# Patient Record
Sex: Female | Born: 1970 | Race: White | Hispanic: No | Marital: Married | Smoking: Never smoker
Health system: Southern US, Community
[De-identification: ages and names within clinical notes are randomized; demographics above are authoritative.]

## PROBLEM LIST (undated history)

## (undated) HISTORY — PX: ADENOIDECTOMY: SUR15

## (undated) HISTORY — PX: TONSILLECTOMY: SUR1361

---

## 1991-10-16 HISTORY — PX: BREAST EXCISIONAL BIOPSY: SUR124

## 1998-10-15 HISTORY — PX: BREAST EXCISIONAL BIOPSY: SUR124

## 2000-08-28 ENCOUNTER — Other Ambulatory Visit: Admission: RE | Admit: 2000-08-28 | Discharge: 2000-08-28 | Payer: Self-pay | Admitting: *Deleted

## 2001-05-08 ENCOUNTER — Encounter: Admission: RE | Admit: 2001-05-08 | Discharge: 2001-05-08 | Payer: Self-pay | Admitting: *Deleted

## 2001-05-08 ENCOUNTER — Encounter: Payer: Self-pay | Admitting: *Deleted

## 2001-10-23 ENCOUNTER — Other Ambulatory Visit: Admission: RE | Admit: 2001-10-23 | Discharge: 2001-10-23 | Payer: Self-pay | Admitting: Obstetrics and Gynecology

## 2001-10-30 ENCOUNTER — Encounter: Admission: RE | Admit: 2001-10-30 | Discharge: 2002-01-28 | Payer: Self-pay

## 2002-03-25 ENCOUNTER — Other Ambulatory Visit: Admission: RE | Admit: 2002-03-25 | Discharge: 2002-03-25 | Payer: Self-pay | Admitting: Obstetrics and Gynecology

## 2002-07-23 ENCOUNTER — Ambulatory Visit (HOSPITAL_COMMUNITY): Admission: RE | Admit: 2002-07-23 | Discharge: 2002-07-23 | Payer: Self-pay | Admitting: Obstetrics and Gynecology

## 2002-08-26 ENCOUNTER — Encounter (HOSPITAL_COMMUNITY): Admission: RE | Admit: 2002-08-26 | Discharge: 2002-09-25 | Payer: Self-pay | Admitting: *Deleted

## 2002-09-26 ENCOUNTER — Inpatient Hospital Stay (HOSPITAL_COMMUNITY): Admission: AD | Admit: 2002-09-26 | Discharge: 2002-09-28 | Payer: Self-pay | Admitting: Obstetrics and Gynecology

## 2002-10-13 ENCOUNTER — Encounter: Admission: RE | Admit: 2002-10-13 | Discharge: 2002-11-12 | Payer: Self-pay | Admitting: Obstetrics and Gynecology

## 2002-10-26 ENCOUNTER — Other Ambulatory Visit: Admission: RE | Admit: 2002-10-26 | Discharge: 2002-10-26 | Payer: Self-pay | Admitting: Obstetrics and Gynecology

## 2003-04-15 ENCOUNTER — Other Ambulatory Visit: Admission: RE | Admit: 2003-04-15 | Discharge: 2003-04-15 | Payer: Self-pay | Admitting: Obstetrics and Gynecology

## 2004-04-18 ENCOUNTER — Other Ambulatory Visit: Admission: RE | Admit: 2004-04-18 | Discharge: 2004-04-18 | Payer: Self-pay | Admitting: Obstetrics and Gynecology

## 2005-06-12 ENCOUNTER — Other Ambulatory Visit: Admission: RE | Admit: 2005-06-12 | Discharge: 2005-06-12 | Payer: Self-pay | Admitting: Obstetrics and Gynecology

## 2008-07-05 ENCOUNTER — Encounter: Admission: RE | Admit: 2008-07-05 | Discharge: 2008-07-05 | Payer: Self-pay | Admitting: Obstetrics and Gynecology

## 2008-07-07 ENCOUNTER — Encounter: Admission: RE | Admit: 2008-07-07 | Discharge: 2008-07-07 | Payer: Self-pay | Admitting: Obstetrics and Gynecology

## 2008-12-27 ENCOUNTER — Encounter: Admission: RE | Admit: 2008-12-27 | Discharge: 2008-12-27 | Payer: Self-pay | Admitting: Obstetrics and Gynecology

## 2009-02-18 ENCOUNTER — Encounter: Admission: RE | Admit: 2009-02-18 | Discharge: 2009-02-18 | Payer: Self-pay | Admitting: Obstetrics and Gynecology

## 2009-04-04 ENCOUNTER — Encounter (HOSPITAL_COMMUNITY): Admission: RE | Admit: 2009-04-04 | Discharge: 2009-07-03 | Payer: Self-pay | Admitting: Endocrinology

## 2009-04-27 ENCOUNTER — Ambulatory Visit (HOSPITAL_COMMUNITY): Admission: RE | Admit: 2009-04-27 | Discharge: 2009-04-27 | Payer: Self-pay | Admitting: Endocrinology

## 2010-07-03 ENCOUNTER — Encounter: Admission: RE | Admit: 2010-07-03 | Discharge: 2010-07-03 | Payer: Self-pay | Admitting: Obstetrics and Gynecology

## 2010-11-05 ENCOUNTER — Encounter: Payer: Self-pay | Admitting: Neurology

## 2011-01-21 LAB — HCG, SERUM, QUALITATIVE: Preg, Serum: NEGATIVE

## 2011-03-02 NOTE — Consult Note (Signed)
[facility]  Patient:    Deborah Frederick, Deborah Frederick Visit Number: [id number] MRN: [id number]          Service Type: PMG Location: TPC Attending Physician:  Sondra Come Dictated by:   Sondra Come, M.D. Proc. Date: 11/14/01 Admit Date:  10/30/2001   CC:         Sheran Luz, M.D., [facility]   Consultation Report  HISTORY OF PRESENT ILLNESS:  Ms. Mcglynn returns to clinic as scheduled for reevaluation.  She was initially seen on October 31, 2001, at which time she underwent a left S1 selective nerve root block/transforaminal epidural steroid injection for a presumed left S1 distribution radiculitis.  The patient states that she had nearly completely relief for approximately three days but the pain has returned back to baseline.  She continues to have pain mainly when she lays down.  She also notes it inconsistently when she sits for long periods of time.  She denies any pain with walking or running.  Her pain is a 4-5/10 on a subjective scale.  Her function and quality of life indices improved remarkably for the first three days after the injection although have declined somewhat since.  She states that her sleep is good when she takes Ultracet.  She notes that she has tried neuromuscular stimulation in the past and chiropractic manipulation without any significant relief.  We discussed treatment options at length.  I reviewed the health and history form, and 14-point review of systems.  Incidentally, Mrs. 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  PHYSICAL EXAMINATION:  GENERAL:  The patient is a healthy female in no acute distress.  VITAL SIGNS:  Blood pressure is 115/72, pulse 65, respirations 18, O2 saturation is 99% on room air.  MUSCULOSKELETAL:  Manual muscle testing is 5/5 bilateral  lower extremities. Sensory examination is intact to light touch bilateral lower extremities in all dermatomal distributions.  Muscle stretch reflexes are 2+/4 bilateral patellar, medial hamstring, and Achilles.  Straight leg raise test and FABER are negative bilaterally.  IMPRESSION: 1. 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  Etiology is    uncertain.  I question the clinical significance of the Tarlovs cyst which    resides at S2 level.  It is quite possible that there may be a component of    S2 radiculopathy involved and the relief from the S1 transforaminal    epidural steroid injection was due to extravasation of the injectate    inferiorly to the S2 dorsal root ganglion. 2. No clinical evidence of sacroiliac joint pain.  PLAN: 1. I had a thorough discussion with Mrs. Breiner regarding treatment options.    These would include repeat transforaminal epidural steroid injection but I    am skeptical of the long-term benefit of an S1 transforaminal epidural    steroid injection.  We can also consider performing an S2 selective nerve    root block with possible cyst aspiration.  Could also consider CT-guided    cyst aspiration diagnostically and therapeutically as well as possible    injection of fibrin glue which has been reported to help decrease the    recurrence of Tarlovs cysts.  I think surgical intervention for this    Tarlovs cyst would be based solely on true diagnostic evidence that the    cyst is  causing the pain and the patients symptoms.  At this point, I    think this would be the last resort. 2. After a long discussion with the patient regarding these options, I think    it is reasonable that I consult colleagues and formulate a plan to address    this problem.  I discussed this with Mrs. Sardinas who is in agreement. 3. I will contact the patient after discussing with colleagues and formulate a    plan with her input.  The patient was educated on the  above findings and recommendations, and understands.  There were no barriers to communication. Dictated by:   Sondra Come, M.D. Attending Physician:  Sondra Come DD:  11/15/01 TD:  11/16/01 Job: [id number]

## 2011-03-02 NOTE — Consult Note (Signed)
[facility]  Patient:    Deborah Frederick, Deborah Frederick Visit Number: [id number] MRN: [id number]          Service Type: PMG Location: TPC Attending Physician:  Sondra Come Dictated by:   Sondra Come, M.D. Proc. Date: 10/31/01 Admit Date:  10/30/2001   CC:         Deborah Frederick, M.D., [facility]   Consultation Report  NEW PATIENT CONSULTATION  REFERRING Deborah Frederick:  Dr. Sheran Frederick, [facility]  Dear Dr. Ethelene Frederick,  Thank you very much for kindly referring your wife, Deborah Frederick, [facility] for evaluation and possible left S1 selective nerve root block.  Deborah Frederick was evaluated in the clinic on October 31, 2001, and underwent an uneventful left S1 selective nerve root block.  Please refer to the following for details regarding the history, physical examination and treatment plan.  Once again, thank you for allowing [place] to participate in the care of your wife.  CHIEF COMPLAINT:  Left posterior thigh and calf pain.  HISTORY OF PRESENT ILLNESS:  Deborah Frederick is a pleasant 40 year old right hand dominant female who [facility] with a three year history of progressive pain in the left posterior thigh and calf.  Onset of the patients pain is typically when laying down. She denies any similar symptoms with running or sitting.  She had an MRI of her lumbar spine which revealed a 10x15 mm cyst in the sacral canal on the left at the S2 level most compatible with a perineural or Tarlovs cyst.  Also incidentally noted was a right ovarian cyst measuring approximately 2x3 cm. The patient underwent an interlaminar lumbar epidural steroid injection without any relief.  She denies any bowel and bladder dysfunction.  Currently she takes Ultracet typically one and possibly two at bedtime with significant relief of her symptoms.  At this time, she denies any pain in her left  lower extremity.  She has not had any physical therapy.  Her pain symptoms are described as dull and constant without any significant decline in her function and quality of life indices, and her sleep is good with the medications.  I review the health and history form, and 14-point review of systems.  No other neurologic complaints.  PAST MEDICAL HISTORY:  Denies.  PAST SURGICAL HISTORY:  Lumpectomy x2.  FAMILY HISTORY:  Heart disease, hypertension and aneurysm.  SOCIAL HISTORY:  The patient denies smoking or alcohol use.  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  ALLERGIES:  No known drug allergies.  CURRENT MEDICATIONS:  Ultracet and prenatal vitamins.  PHYSICAL EXAMINATION:  GENERAL:  The patient is a healthy female in no acute distress.  The patient is pleasant.  Gait is normal.  Affect and mood are appropriate.  VITAL SIGNS:  Blood pressure 123/77, pulse 87, respirations 18, O2 saturation 98% on room air.  SPINE:  Level pelvis without scoliosis.  There is normal lumbar lordosis. There is minimal tenderness to palpation bilateral lumbar paraspinals if any. Range of motion of the lumbar spine is full in all planes without significant discomfort.  MUSCULOSKELETAL:  Manual muscle testing is 5/5 bilateral lower extremities in all muscle groups tested.  Sensory examination is intact to light touch bilateral lower extremities in all dermatomal distributions.  Muscle stretch reflexes are 2+/4 bilateral patellar, medial hamstrings, and Achilles. Straight leg raise is negative bilaterally.  Deborah Frederick is negative bilaterally. [place] is negative bilaterally.  Sacral compression test is negative bilaterally.  There is no heat, erythema, or edema in the lower extremities.  IMPRESSION: 1. Chronic intermittent left lower extremity radicular symptoms in an S1    distribution. The patient has MRI evidence of a Tarlovs cyst at the left    S2 region which is fairly large.   Anatomically this is not contacting    the S1 nerve root although I question whether or not the cyst may be    contributing to the patients current symptoms when she is in a recumbent    position. During this time gravity may be allowing some cephalad migration    of the cyst causing some irritation to the S1 nerve although this is    unlikely. 2. Physical examination is not consistent with sacroiliac joint dysfunction or    pain.  PLAN:  I had a thorough discussion with Deborah Frederick regarding her symptoms and possible etiology.  We discussed the MRI findings and their implications.  At this point, I believe it is reasonable to proceed with a left S1 selective nerve root block diagnostically and therapeutically.  I described the procedure to the patient in detail including risks, benefits, limitations and alternatives.  She wishes to proceed.  DESCRIPTION OF PROCEDURE:  Deborah Frederick was brought back to the fluoroscopy suite and placed on the table in prone position.  Her skin was prepped and draped in the usual sterile fashion.  Skin and subcutaneous tissue was anesthetized with 3 cc of 1% lidocaine. Under direct fluoroscopic guidance, a 22-gauge 3-1/2 inch spinal needle was advanced into the left S1 neural foramen.  The needle tip placement was confirmed in oblique, AP and lateral views.  After negative aspiration, injection of 1 cc of Omnipaque 180 revealed a left S1 epiradiculogram. There was no pain or paresthesia noted.  No vascular uptake was noted.  This  was then injected with 1 cc of Kenalog 40 mg/cc plus 1 cc of preservative free 1% lidocaine with needle flush.  No complications.  The patient tolerated the procedure well. Post-injection pain level is 0/10.  RECOMMENDATIONS: 1. Continue current medications. 2. I had the discussion with Dr. Ethelene Frederick and Deborah Frederick regarding possible    further options which may include repeat selective nerve root block versus    CT guided needle  aspiration of the Tarlovs cyst diagnostically and     therapeutically versus possible injection of fibrin glue into the cyst if    this indeed is the source of her pain. 3. Will have Mrs. Dechellis return to clinic in two weeks for reevaluation.  Will    predicate further intervention on the patients response to the above    procedure.  The patient was educated in the above findings and recommendations, and understands.  There were no barriers to communication.  Mrs. Schroeter was evaluated in a chaperoned environment. Dictated by:   Sondra Come, M.D. Attending Physician:  Sondra Come DD:  11/01/01 TD:  11/03/01 Job: [phone] [id number]

## 2011-05-28 ENCOUNTER — Other Ambulatory Visit: Payer: Self-pay | Admitting: Obstetrics and Gynecology

## 2011-05-28 DIAGNOSIS — Z1231 Encounter for screening mammogram for malignant neoplasm of breast: Secondary | ICD-10-CM

## 2011-07-09 ENCOUNTER — Ambulatory Visit
Admission: RE | Admit: 2011-07-09 | Discharge: 2011-07-09 | Disposition: A | Discharge status: Home / Self Care | Payer: No Typology Code available for payment source | Source: Ambulatory Visit | Attending: Obstetrics and Gynecology | Admitting: Obstetrics and Gynecology

## 2011-07-09 DIAGNOSIS — Z1231 Encounter for screening mammogram for malignant neoplasm of breast: Secondary | ICD-10-CM

## 2012-06-10 ENCOUNTER — Other Ambulatory Visit: Payer: Self-pay | Admitting: Obstetrics and Gynecology

## 2012-06-10 DIAGNOSIS — Z1231 Encounter for screening mammogram for malignant neoplasm of breast: Secondary | ICD-10-CM

## 2012-07-09 ENCOUNTER — Ambulatory Visit
Admission: RE | Admit: 2012-07-09 | Discharge: 2012-07-09 | Disposition: A | Discharge status: Home / Self Care | Payer: No Typology Code available for payment source | Source: Ambulatory Visit | Attending: Obstetrics and Gynecology | Admitting: Obstetrics and Gynecology

## 2012-07-09 DIAGNOSIS — Z1231 Encounter for screening mammogram for malignant neoplasm of breast: Secondary | ICD-10-CM

## 2013-06-01 ENCOUNTER — Other Ambulatory Visit: Payer: Self-pay

## 2013-06-01 DIAGNOSIS — Z1231 Encounter for screening mammogram for malignant neoplasm of breast: Secondary | ICD-10-CM

## 2013-07-10 ENCOUNTER — Ambulatory Visit
Admission: RE | Admit: 2013-07-10 | Discharge: 2013-07-10 | Disposition: A | Discharge status: Home / Self Care | Payer: No Typology Code available for payment source | Source: Ambulatory Visit

## 2013-07-10 DIAGNOSIS — Z1231 Encounter for screening mammogram for malignant neoplasm of breast: Secondary | ICD-10-CM

## 2013-07-15 ENCOUNTER — Other Ambulatory Visit: Payer: Self-pay | Admitting: Obstetrics and Gynecology

## 2013-07-15 DIAGNOSIS — R928 Other abnormal and inconclusive findings on diagnostic imaging of breast: Secondary | ICD-10-CM

## 2013-07-21 ENCOUNTER — Ambulatory Visit
Admission: RE | Admit: 2013-07-21 | Discharge: 2013-07-21 | Disposition: A | Discharge status: Home / Self Care | Payer: Self-pay | Source: Ambulatory Visit | Attending: Obstetrics and Gynecology | Admitting: Obstetrics and Gynecology

## 2013-07-21 DIAGNOSIS — R928 Other abnormal and inconclusive findings on diagnostic imaging of breast: Secondary | ICD-10-CM

## 2014-06-14 ENCOUNTER — Other Ambulatory Visit: Payer: Self-pay

## 2014-06-14 DIAGNOSIS — Z1231 Encounter for screening mammogram for malignant neoplasm of breast: Secondary | ICD-10-CM

## 2014-07-19 ENCOUNTER — Encounter (INDEPENDENT_AMBULATORY_CARE_PROVIDER_SITE_OTHER): Payer: Self-pay

## 2014-07-19 ENCOUNTER — Ambulatory Visit
Admission: RE | Admit: 2014-07-19 | Discharge: 2014-07-19 | Disposition: A | Discharge status: Home / Self Care | Payer: No Typology Code available for payment source | Source: Ambulatory Visit

## 2014-07-19 DIAGNOSIS — Z1231 Encounter for screening mammogram for malignant neoplasm of breast: Secondary | ICD-10-CM

## 2015-06-22 ENCOUNTER — Other Ambulatory Visit: Payer: Self-pay

## 2015-06-22 DIAGNOSIS — Z1231 Encounter for screening mammogram for malignant neoplasm of breast: Secondary | ICD-10-CM

## 2015-07-27 ENCOUNTER — Ambulatory Visit
Admission: RE | Admit: 2015-07-27 | Discharge: 2015-07-27 | Disposition: A | Discharge status: Home / Self Care | Payer: No Typology Code available for payment source | Source: Ambulatory Visit

## 2015-07-27 DIAGNOSIS — Z1231 Encounter for screening mammogram for malignant neoplasm of breast: Secondary | ICD-10-CM

## 2016-06-19 ENCOUNTER — Other Ambulatory Visit: Payer: Self-pay | Admitting: Obstetrics and Gynecology

## 2016-06-19 DIAGNOSIS — Z1231 Encounter for screening mammogram for malignant neoplasm of breast: Secondary | ICD-10-CM

## 2016-07-27 ENCOUNTER — Ambulatory Visit
Admission: RE | Admit: 2016-07-27 | Discharge: 2016-07-27 | Disposition: A | Discharge status: Home / Self Care | Payer: Managed Care, Other (non HMO) | Source: Ambulatory Visit | Attending: Obstetrics and Gynecology | Admitting: Obstetrics and Gynecology

## 2016-07-27 DIAGNOSIS — Z1231 Encounter for screening mammogram for malignant neoplasm of breast: Secondary | ICD-10-CM

## 2017-06-18 ENCOUNTER — Other Ambulatory Visit: Payer: Self-pay | Admitting: Obstetrics and Gynecology

## 2017-06-18 DIAGNOSIS — Z1231 Encounter for screening mammogram for malignant neoplasm of breast: Secondary | ICD-10-CM

## 2017-07-29 ENCOUNTER — Ambulatory Visit
Admission: RE | Admit: 2017-07-29 | Discharge: 2017-07-29 | Disposition: A | Discharge status: Home / Self Care | Payer: Managed Care, Other (non HMO) | Source: Ambulatory Visit | Attending: Obstetrics and Gynecology | Admitting: Obstetrics and Gynecology

## 2017-07-29 DIAGNOSIS — Z1231 Encounter for screening mammogram for malignant neoplasm of breast: Secondary | ICD-10-CM

## 2018-02-25 DIAGNOSIS — Z1212 Encounter for screening for malignant neoplasm of rectum: Secondary | ICD-10-CM | POA: Diagnosis not present

## 2018-02-25 DIAGNOSIS — Z6825 Body mass index (BMI) 25.0-25.9, adult: Secondary | ICD-10-CM | POA: Diagnosis not present

## 2018-02-25 DIAGNOSIS — Z01419 Encounter for gynecological examination (general) (routine) without abnormal findings: Secondary | ICD-10-CM | POA: Diagnosis not present

## 2018-02-27 DIAGNOSIS — M2242 Chondromalacia patellae, left knee: Secondary | ICD-10-CM | POA: Diagnosis not present

## 2018-02-27 DIAGNOSIS — M1712 Unilateral primary osteoarthritis, left knee: Secondary | ICD-10-CM | POA: Diagnosis not present

## 2018-03-25 DIAGNOSIS — Z0000 Encounter for general adult medical examination without abnormal findings: Secondary | ICD-10-CM | POA: Diagnosis not present

## 2018-03-25 DIAGNOSIS — E0500 Thyrotoxicosis with diffuse goiter without thyrotoxic crisis or storm: Secondary | ICD-10-CM | POA: Diagnosis not present

## 2018-04-01 DIAGNOSIS — Z0000 Encounter for general adult medical examination without abnormal findings: Secondary | ICD-10-CM | POA: Diagnosis not present

## 2018-04-01 DIAGNOSIS — Z800 Family history of malignant neoplasm of digestive organs: Secondary | ICD-10-CM | POA: Diagnosis not present

## 2018-04-01 DIAGNOSIS — Z1389 Encounter for screening for other disorder: Secondary | ICD-10-CM | POA: Diagnosis not present

## 2018-04-22 DIAGNOSIS — D1801 Hemangioma of skin and subcutaneous tissue: Secondary | ICD-10-CM | POA: Diagnosis not present

## 2018-04-22 DIAGNOSIS — Z85828 Personal history of other malignant neoplasm of skin: Secondary | ICD-10-CM | POA: Diagnosis not present

## 2018-04-22 DIAGNOSIS — L570 Actinic keratosis: Secondary | ICD-10-CM | POA: Diagnosis not present

## 2018-04-22 DIAGNOSIS — L821 Other seborrheic keratosis: Secondary | ICD-10-CM | POA: Diagnosis not present

## 2018-04-22 DIAGNOSIS — I788 Other diseases of capillaries: Secondary | ICD-10-CM | POA: Diagnosis not present

## 2018-06-20 ENCOUNTER — Other Ambulatory Visit: Payer: Self-pay | Admitting: Obstetrics and Gynecology

## 2018-06-20 DIAGNOSIS — Z1231 Encounter for screening mammogram for malignant neoplasm of breast: Secondary | ICD-10-CM

## 2018-07-30 ENCOUNTER — Ambulatory Visit
Admission: RE | Admit: 2018-07-30 | Discharge: 2018-07-30 | Disposition: A | Discharge status: Home / Self Care | Payer: BLUE CROSS/BLUE SHIELD | Source: Ambulatory Visit | Attending: Obstetrics and Gynecology | Admitting: Obstetrics and Gynecology

## 2018-07-30 DIAGNOSIS — Z1231 Encounter for screening mammogram for malignant neoplasm of breast: Secondary | ICD-10-CM

## 2018-09-01 DIAGNOSIS — H524 Presbyopia: Secondary | ICD-10-CM | POA: Diagnosis not present

## 2018-09-30 DIAGNOSIS — L245 Irritant contact dermatitis due to other chemical products: Secondary | ICD-10-CM | POA: Diagnosis not present

## 2018-09-30 DIAGNOSIS — Z85828 Personal history of other malignant neoplasm of skin: Secondary | ICD-10-CM | POA: Diagnosis not present

## 2019-01-05 DIAGNOSIS — L821 Other seborrheic keratosis: Secondary | ICD-10-CM | POA: Diagnosis not present

## 2019-01-05 DIAGNOSIS — Z85828 Personal history of other malignant neoplasm of skin: Secondary | ICD-10-CM | POA: Diagnosis not present

## 2019-01-05 DIAGNOSIS — L245 Irritant contact dermatitis due to other chemical products: Secondary | ICD-10-CM | POA: Diagnosis not present

## 2019-01-05 DIAGNOSIS — L570 Actinic keratosis: Secondary | ICD-10-CM | POA: Diagnosis not present

## 2019-02-04 DIAGNOSIS — L509 Urticaria, unspecified: Secondary | ICD-10-CM | POA: Diagnosis not present

## 2019-02-04 DIAGNOSIS — R591 Generalized enlarged lymph nodes: Secondary | ICD-10-CM | POA: Diagnosis not present

## 2019-02-27 DIAGNOSIS — Z6824 Body mass index (BMI) 24.0-24.9, adult: Secondary | ICD-10-CM | POA: Diagnosis not present

## 2019-02-27 DIAGNOSIS — Z01419 Encounter for gynecological examination (general) (routine) without abnormal findings: Secondary | ICD-10-CM | POA: Diagnosis not present

## 2019-03-04 DIAGNOSIS — M2242 Chondromalacia patellae, left knee: Secondary | ICD-10-CM | POA: Diagnosis not present

## 2019-04-06 DIAGNOSIS — E0500 Thyrotoxicosis with diffuse goiter without thyrotoxic crisis or storm: Secondary | ICD-10-CM | POA: Diagnosis not present

## 2019-04-06 DIAGNOSIS — L509 Urticaria, unspecified: Secondary | ICD-10-CM | POA: Diagnosis not present

## 2019-04-06 DIAGNOSIS — R591 Generalized enlarged lymph nodes: Secondary | ICD-10-CM | POA: Diagnosis not present

## 2019-04-08 DIAGNOSIS — E0500 Thyrotoxicosis with diffuse goiter without thyrotoxic crisis or storm: Secondary | ICD-10-CM | POA: Diagnosis not present

## 2019-04-08 DIAGNOSIS — Z0000 Encounter for general adult medical examination without abnormal findings: Secondary | ICD-10-CM | POA: Diagnosis not present

## 2019-04-09 DIAGNOSIS — E7849 Other hyperlipidemia: Secondary | ICD-10-CM | POA: Diagnosis not present

## 2019-04-09 DIAGNOSIS — R82998 Other abnormal findings in urine: Secondary | ICD-10-CM | POA: Diagnosis not present

## 2019-04-13 DIAGNOSIS — R591 Generalized enlarged lymph nodes: Secondary | ICD-10-CM | POA: Diagnosis not present

## 2019-04-13 DIAGNOSIS — Z800 Family history of malignant neoplasm of digestive organs: Secondary | ICD-10-CM | POA: Diagnosis not present

## 2019-04-13 DIAGNOSIS — Z1331 Encounter for screening for depression: Secondary | ICD-10-CM | POA: Diagnosis not present

## 2019-04-13 DIAGNOSIS — L509 Urticaria, unspecified: Secondary | ICD-10-CM | POA: Diagnosis not present

## 2019-04-13 DIAGNOSIS — E0500 Thyrotoxicosis with diffuse goiter without thyrotoxic crisis or storm: Secondary | ICD-10-CM | POA: Diagnosis not present

## 2019-04-13 DIAGNOSIS — Z0000 Encounter for general adult medical examination without abnormal findings: Secondary | ICD-10-CM | POA: Diagnosis not present

## 2019-04-30 ENCOUNTER — Ambulatory Visit (INDEPENDENT_AMBULATORY_CARE_PROVIDER_SITE_OTHER): Payer: BC Managed Care – PPO | Admitting: Allergy

## 2019-04-30 ENCOUNTER — Encounter: Payer: Self-pay | Admitting: Allergy

## 2019-04-30 ENCOUNTER — Other Ambulatory Visit: Payer: Self-pay

## 2019-04-30 VITALS — BP 114/70 | HR 87 | Temp 98.4°F | Resp 17 | Ht 69.0 in | Wt 165.5 lb

## 2019-04-30 DIAGNOSIS — L299 Pruritus, unspecified: Secondary | ICD-10-CM | POA: Insufficient documentation

## 2019-04-30 DIAGNOSIS — R21 Rash and other nonspecific skin eruption: Secondary | ICD-10-CM

## 2019-04-30 MED ORDER — HYDROXYZINE HCL 25 MG PO TABS: 25.0000 mg | ORAL_TABLET | Freq: Every evening | ORAL | 3 refills | Status: AC | PRN

## 2019-04-30 NOTE — Patient Instructions (Addendum)
Today's skin testing showed: Negative to environmental allergy panel and basic common foods.    Stop all supplements for at least 1 month.  Continue zyrtec 10mg  in the morning.  Take hydroxyzine 25mg  at night 1 hour before bedtime as needed for itching. This replaces benadryl.  Stop Singulair.     Start proper skin care as below.   Get bloodwork   If rash still persistent and bloodwork negative then recommend patch testing next.   Follow up in 4 weeks   Skin care recommendations  Bath time: . Always use lukewarm water. AVOID very hot or cold water. Marland Kitchen Keep bathing time to 5-10 minutes. . Do NOT use bubble bath. . Use a mild soap and use just enough to wash the dirty areas. . Do NOT scrub skin vigorously.  . After bathing, pat dry your skin with a towel. Do NOT rub or scrub the skin.  Moisturizers and prescriptions:  . ALWAYS apply moisturizers immediately after bathing (within 3 minutes). This helps to lock-in moisture. . Use the moisturizer several times a day over the whole body. Kermit Balo summer moisturizers include: Aveeno, CeraVe, Cetaphil. Kermit Balo winter moisturizers include: Aquaphor, Vaseline, Cerave, Cetaphil, Eucerin, Vanicream. . When using moisturizers along with medications, the moisturizer should be applied about one hour after applying the medication to prevent diluting effect of the medication or moisturize around where you applied the medications. When not using medications, the moisturizer can be continued twice daily as maintenance.  Laundry and clothing: . Avoid laundry products with added color or perfumes. . Use unscented hypo-allergenic laundry products such as Tide free, Cheer free & gentle, and All free and clear.  . If the skin still seems dry or sensitive, you can try double-rinsing the clothes. . Avoid tight or scratchy clothing such as wool. . Do not use fabric softeners or dyer sheets.

## 2019-04-30 NOTE — Progress Notes (Signed)
New Patient Note  RE: Deborah Frederick MRN: [id number] DOB: 09/09/1971 Date of Office Visit: 04/30/2019  Referring provider: Chilton GreathouseAvva, Ravisankar, MD Primary care provider: Chilton GreathouseAvva, Ravisankar, MD  Chief Complaint: Rash  History of Present Illness: I had the pleasure of seeing Deborah MochaLeslie Frederick for initial evaluation at the [facility] on 04/30/2019. She is a 48 y.o. female, who is referred here by Chilton GreathouseAvva, Ravisankar, MD for the evaluation of rash.   Rash: Rash started about 4 months ago. Started under the bra strap in the middle of the back. Then it spread to her legs, arms and abdominal area.   Describes the rash as pruritic, erythematous, raised and bumpy. Individual rashes lasts about a few weeks. No ecchymosis upon resolution. Associated symptoms include: none. Suspected triggers are unknown. She does a lot of her yardwork at home. Denies coming into contact with anything new. She has been avoiding yardwork for the past few weeks with no improvement in her rash. No one else has this rash at home. She does take some OTC supplements which she was on prior to the rash starting. Denies recent tick bites. Does consume red meat on a consistent basis.   Denies any fevers, chills, changes in medications, foods, personal care products or recent infections. She has tried the following therapies: triamcinolone cream, zyrtec, benadryl, singulair with some benefit. Systemic steroids 2 courses with some benefit but no complete resolution. Currently on zyrtec 10mg  and Singulair 10mg  in the AM and benadryl at night.  Previous work up includes: none. Had recent bloodwork which showed normal CBC and TSH.  Previous history of rash/hives: denies. Patient is up to date with the following cancer screening tests: mammogram, pap smears.  Assessment and Plan: Deborah Frederick is a 48 y.o. female with: Rash and other nonspecific skin eruption Pruritic erythematous rash for the past 4 months with no triggers noted.  Denies changes in diet, medications, personal care products or recent infections. Tried triamcinolone cream, zyrtec, benadryl and Singulair with some benefit. Had 2 courses of prednisone which helped but never had complete resolution of symptoms.   Today's skin testing showed: Negative to environmental allergy panel and basic common foods.   Stop all OTC supplements for 1 month.  Continue zyrtec 10mg  in the morning.  Take hydroxyzine 25mg  at night 1 hour before bedtime as needed for itching. This replaces benadryl.  Stop Singulair.    Discussed proper skin care.   Get bloodwork to rule out any other underlying etiologies.   May use topical triamcinolone cream BID prn. Do not use on the neck or face.   If rash still persistent and bloodwork negative then recommend patch testing next.  Pruritus  See assessment and plan as above for rash.   Return in about 4 weeks (around 05/28/2019).  Meds ordered this encounter  Medications  . hydrOXYzine (ATARAX/VISTARIL) 25 MG tablet    Sig: Take 1 tablet (25 mg total) by mouth at bedtime as needed for itching (1 hour before bedtime).    Dispense:  30 tablet    Refill:  3    Lab Orders     ANA w/Reflex     Alpha-Gal Panel     Comprehensive metabolic panel     Tryptase  Other allergy screening: Asthma: no Rhino conjunctivitis: yes  She reports symptoms of sneezing, PND. Symptoms have been going on for few years. The symptoms are present during the spring and summer. lShe has used no medications for this. Previous work  up includes: none. Food allergy: no Medication allergy: no Hymenoptera allergy: no Eczema:no History of recurrent infections suggestive of immunodeficency: no  Diagnostics: Skin Testing: Environmental allergy panel and select foods. Negative test to: environmental allergy panel and basic common foods.  Results discussed with patient/family. Airborne Adult Perc - 04/30/19 1017    Time Antigen Placed  1017     Allergen Manufacturer  Lavella Hammock    Location  Back    Number of Test  59    Panel 1  Select    1. Control-Buffer 50% Glycerol  Negative    2. Control-Histamine 1 mg/ml  4+    3. Albumin saline  Negative    4. [place]  Negative    5. [place]  Negative    6. Johnson  Negative    7. [place] Blue  Negative    8. Meadow Fescue  Negative    9. Perennial Rye  Negative    10. Sweet Vernal  Negative    11. Timothy  Negative    12. Cocklebur  Negative    13. Burweed Marshelder  Negative    14. Ragweed, short  Negative    15. Ragweed, Giant  Negative    16. Plantain,  English  Negative    17. Lamb's Quarters  Negative    18. Sheep Sorrell  Negative    19. Rough Pigweed  Negative    20. Marsh Elder, Rough  Negative    21. Mugwort, Common  Negative    22. Ash mix  Negative    23. Birch mix  Negative    24. Beech American  Negative    25. Box, Elder  Negative    26. Cedar, red  Negative    27. Cottonwood, [place]  Negative    28. Elm mix  Negative    29. Hickory mix  Negative    30. Maple mix  Negative    31. Oak, [place] mix  Negative    32. Pecan Pollen  Negative    33. Pine mix  Negative    34. Sycamore Eastern  Negative    35. [place], Black Pollen  Negative    36. Alternaria alternata  Negative    37. Cladosporium Herbarum  Negative    38. Aspergillus mix  Negative    39. Penicillium mix  Negative    40. Bipolaris sorokiniana (Helminthosporium)  Negative    41. Drechslera spicifera (Curvularia)  Negative    42. Mucor plumbeus  Negative    43. Fusarium moniliforme  Negative    44. Aureobasidium pullulans (pullulara)  Negative    45. Rhizopus oryzae  Negative    46. Botrytis cinera  Negative    47. Epicoccum nigrum  Negative    48. Phoma betae  Negative    49. Candida Albicans  Negative    50. Trichophyton mentagrophytes  Negative    51. Mite, D Farinae  5,000 AU/ml  Negative    52. Mite, D Pteronyssinus  5,000 AU/ml  Negative    53. Cat Hair 10,000 BAU/ml  Negative    54.   Dog Epithelia  Negative    55. Mixed Feathers  Negative    56. Horse Epithelia  Negative    57. Cockroach, German  Negative    58. Mouse  Negative    59. Tobacco Leaf  Negative     Food Perc - 04/30/19 1018    Time Antigen Placed  1018    Allergen Manufacturer  Lavella Hammock  Location  Back    Number of allergen test  10    Food  Select    1. Peanut  Negative    2. Soybean food  Negative    3. Wheat, whole  Negative    4. Sesame  Negative    5. Milk, cow  Negative    6. Egg White, chicken  Negative    7. Casein  Negative    8. Shellfish mix  Negative    9. Fish mix  Negative    10. Cashew  Negative       Past Medical History: Patient Active Problem List   Diagnosis Date Noted  . Pruritus 04/30/2019  . Rash and other nonspecific skin eruption 04/30/2019   History reviewed. No pertinent past medical history. Past Surgical History: Past Surgical History:  Procedure Laterality Date  . ADENOIDECTOMY    . BREAST EXCISIONAL BIOPSY Left 1993  . BREAST EXCISIONAL BIOPSY Left 2000  . TONSILLECTOMY     Medication List:  Current Outpatient Medications  Medication Sig Dispense Refill  . cetirizine (ZYRTEC) 10 MG tablet Take 10 mg by mouth daily as needed for allergies.    Marland Kitchen. SYNTHROID 137 MCG tablet     . triamcinolone cream (KENALOG) 0.1 %     . hydrOXYzine (ATARAX/VISTARIL) 25 MG tablet Take 1 tablet (25 mg total) by mouth at bedtime as needed for itching (1 hour before bedtime). 30 tablet 3   No current facility-administered medications for this visit.    Allergies: No Known Allergies Social History: Social History   Socioeconomic History  . Marital status: Married    Spouse name: Not on file  . Number of children: Not on file  . Years of education: Not on file  . Highest education level: Not on file  Occupational History  . Not on file  Social Needs  . Financial resource strain: Not on file  . Food insecurity    Worry: Not on file    Inability: Not on file  .  Transportation needs    Medical: Not on file    Non-medical: Not on file  Tobacco Use  . Smoking status: Never Smoker  . Smokeless tobacco: Never Used  Substance and Sexual Activity  . Alcohol use: Yes    Alcohol/week: 1.0 standard drinks    Types: 1 Glasses of wine per week  . Drug use: Not on file  . Sexual activity: Not on file  Lifestyle  . Physical activity    Days per week: Not on file    Minutes per session: Not on file  . Stress: Not on file  Relationships  . Social Musicianconnections    Talks on phone: Not on file    Gets together: Not on file    Attends religious service: Not on file    Active member of club or organization: Not on file    Attends meetings of clubs or organizations: Not on file    Relationship status: Not on file  Other Topics Concern  . Not on file  Social History Narrative  . Not on file   Lives in a house which is 49 year old. Smoking: denies Occupation: stays at home  Environmental History: Water Damage/mildew in the house: no Carpet in the family room: no Carpet in the bedroom: no Heating: electric Cooling: central Pet: yes 1 dog x 10 yrs  Family History: Family History  Problem Relation Age of Onset  . Breast cancer Maternal Grandmother   .  Asthma Mother   . Allergic rhinitis Father    Review of Systems  Constitutional: Negative for appetite change, chills, fever and unexpected weight change.  HENT: Negative for congestion and rhinorrhea.   Eyes: Negative for itching.  Respiratory: Negative for cough, chest tightness, shortness of breath and wheezing.   Cardiovascular: Negative for chest pain.  Gastrointestinal: Negative for abdominal pain.  Genitourinary: Negative for difficulty urinating.  Skin: Positive for rash.  Allergic/Immunologic: Negative for environmental allergies and food allergies.  Neurological: Negative for headaches.   Objective: BP 114/70 (BP Location: Right Arm, Patient Position: Sitting, Cuff Size: Normal)    Pulse 87   Temp 98.4 F (36.9 C) (Temporal)   Resp 17   Ht 5\' 9"  (1.753 m)   Wt 165 lb 8 oz (75.1 kg)   SpO2 97%   BMI 24.44 kg/m  Body mass index is 24.44 kg/m. Physical Exam  Constitutional: She is oriented to person, place, and time. She appears well-developed and well-nourished.  HENT:  Head: Normocephalic and atraumatic.  Right Ear: External ear normal.  Left Ear: External ear normal.  Nose: Nose normal.  Mouth/Throat: Oropharynx is clear and moist.  Eyes: Conjunctivae and EOM are normal.  Neck: Neck supple.  Cardiovascular: Normal rate, regular rhythm and normal heart sounds. Exam reveals no gallop and no friction rub.  No murmur heard. Pulmonary/Chest: Effort normal and breath sounds normal. She has no wheezes. She has no rales.  Abdominal: Soft.  Neurological: She is alert and oriented to person, place, and time.  Skin: Skin is warm. Rash noted.  Erythematous maculopapular rash diffusely present on torso, upper and lower extremities b/l.  Psychiatric: She has a normal mood and affect. Her behavior is normal.  Nursing note and vitals reviewed.  The plan was reviewed with the patient/family, and all questions/concerned were addressed.  It was my pleasure to see Deborah Frederick today and participate in her care. Please feel free to contact me with any questions or concerns.  Sincerely,  Wyline MoodYoon Bilbo Carcamo, DO Allergy & Immunology  [facility] [place] office: [phone] [facility] office: [phone] [place] office: [phone]

## 2019-04-30 NOTE — Assessment & Plan Note (Signed)
   See assessment and plan as above for rash. 

## 2019-04-30 NOTE — Assessment & Plan Note (Addendum)
Pruritic erythematous rash for the past 4 months with no triggers noted. Denies changes in diet, medications, personal care products or recent infections. Tried triamcinolone cream, zyrtec, benadryl and Singulair with some benefit. Had 2 courses of prednisone which helped but never had complete resolution of symptoms.   Today's skin testing showed: Negative to environmental allergy panel and basic common foods.   Stop all OTC supplements for 1 month.  Continue zyrtec 10mg  in the morning.  Take hydroxyzine 25mg  at night 1 hour before bedtime as needed for itching. This replaces benadryl.  Stop Singulair.    Discussed proper skin care.   Get bloodwork to rule out any other underlying etiologies.   May use topical triamcinolone cream BID prn. Do not use on the neck or face.   If rash still persistent and bloodwork negative then recommend patch testing next.

## 2019-05-07 LAB — COMPREHENSIVE METABOLIC PANEL
ALT: 16 IU/L (ref 0–32)
AST: 16 IU/L (ref 0–40)
Albumin/Globulin Ratio: 1.8 (ref 1.2–2.2)
Albumin: 4.3 g/dL (ref 3.8–4.8)
Alkaline Phosphatase: 72 IU/L (ref 39–117)
BUN/Creatinine Ratio: 16 (ref 9–23)
BUN: 14 mg/dL (ref 6–24)
Bilirubin Total: 0.3 mg/dL (ref 0.0–1.2)
CO2: 23 mmol/L (ref 20–29)
Calcium: 9.4 mg/dL (ref 8.7–10.2)
Chloride: 103 mmol/L (ref 96–106)
Creatinine, Ser: 0.89 mg/dL (ref 0.57–1.00)
GFR calc Af Amer: 88 mL/min/{1.73_m2} (ref >59)
GFR calc non Af Amer: 76 mL/min/{1.73_m2} (ref >59)
Globulin, Total: 2.4 g/dL (ref 1.5–4.5)
Glucose: 96 mg/dL (ref 65–99)
Potassium: 4.2 mmol/L (ref 3.5–5.2)
Sodium: 143 mmol/L (ref 134–144)
Total Protein: 6.7 g/dL (ref 6.0–8.5)

## 2019-05-07 LAB — ENA+DNA/DS+SJORGEN'S
ENA RNP Ab: 2.9 AI — ABNORMAL HIGH (ref 0.0–0.9)
ENA SM Ab Ser-aCnc: <0.2 AI (ref 0.0–0.9)
ENA SSA (RO) Ab: <0.2 AI (ref 0.0–0.9)
ENA SSB (LA) Ab: <0.2 AI (ref 0.0–0.9)
dsDNA Ab: <1 IU/mL (ref 0–9)

## 2019-05-07 LAB — ALPHA-GAL PANEL
Alpha Gal IgE*: <0.1 kU/L (ref <0.10)
Beef (Bos spp) IgE: <0.1 kU/L (ref <0.35)
Class Interpretation: 0
Class Interpretation: 0
Class Interpretation: 0
Lamb/Mutton (Ovis spp) IgE: <0.1 kU/L (ref <0.35)
Pork (Sus spp) IgE: <0.1 kU/L (ref <0.35)

## 2019-05-07 LAB — TRYPTASE: Tryptase: 8 ug/L (ref 2.2–13.2)

## 2019-05-07 LAB — ANA W/REFLEX: Anti Nuclear Antibody (ANA): POSITIVE — AB

## 2019-05-08 ENCOUNTER — Other Ambulatory Visit: Payer: Self-pay | Admitting: Allergy

## 2019-05-08 DIAGNOSIS — R768 Other specified abnormal immunological findings in serum: Secondary | ICD-10-CM

## 2019-05-08 NOTE — Progress Notes (Signed)
rheu

## 2019-05-28 ENCOUNTER — Encounter: Payer: Self-pay | Admitting: Allergy

## 2019-05-28 ENCOUNTER — Ambulatory Visit (INDEPENDENT_AMBULATORY_CARE_PROVIDER_SITE_OTHER): Payer: BC Managed Care – PPO | Admitting: Allergy

## 2019-05-28 ENCOUNTER — Other Ambulatory Visit: Payer: Self-pay

## 2019-05-28 VITALS — BP 118/62 | HR 94 | Temp 97.7°F | Resp 17

## 2019-05-28 DIAGNOSIS — R21 Rash and other nonspecific skin eruption: Secondary | ICD-10-CM

## 2019-05-28 DIAGNOSIS — L299 Pruritus, unspecified: Secondary | ICD-10-CM | POA: Diagnosis not present

## 2019-05-28 NOTE — Assessment & Plan Note (Signed)
   See assessment and plan as above for rash. 

## 2019-05-28 NOTE — Progress Notes (Signed)
Follow Up Note  RE: Deborah Frederick MRN: [id number] DOB: 05/12/1971 Date of Office Visit: 05/28/2019  Referring provider: Chilton GreathouseAvva, Ravisankar, MD Primary care provider: Chilton GreathouseAvva, Ravisankar, MD  Chief Complaint: Pruritus  History of Present Illness: I had the pleasure of seeing Deborah Frederick for a follow up visit at the [facility] on 05/28/2019. She is a 48 y.o. female, who is being followed for pruritus/rash. Today she is here for regular follow up visit. Her previous allergy office visit was on 04/30/2019 with Dr. Selena BattenKim.   Rash  Rash is almost all gone since the last visit. Itching is significantly better as well.  Stopped biotin, probiotics and gummies supplements. She also switched to free and clear laundry detergents with no dryer sheets.  She remembers taking hydroxyzine nightly but sometimes forgets to take zyrtec. Did not notice any worsening symptoms on those days.  It took about 2 weeks for the rash/pruritus to improve.   In the early 2000's she had a full work up with rheumatology regarding her ANA and nothing was found per patient's report.   Assessment and Plan: Deborah Frederick is a 48 y.o. female with: Rash and other nonspecific skin eruption Past history - Pruritic erythematous rash for the past 4 months with no triggers noted. Denies changes in diet, medications, personal care products or recent infections. Tried triamcinolone cream, zyrtec, benadryl and Singulair with some benefit. Had 2 courses of prednisone which helped but never had complete resolution of symptoms. 2020 skin testing showed: Negative to environmental allergy panel and basic common foods.  Interim history - Bloodwork showed: blood count, kidney function, liver function, electrolytes, inflammation markers and alpha gal (checks for red meat allergy) were all normal. ANA and your RNP antibody was positive. Patient had work up by rheumatology in the past and no diagnosis was made. Rash/pruritus completely gone  after 2 weeks - changed laundry detergents, stopped all supplements and started daily antihistamines.   Stop hydroxyzine for 2 weeks and if no issues then stop zyrtec as well. If you have the itching/rash return then restart zyrtec 10mg  daily.   Continue with proper skin care.   If you are interested in re-starting the supplements, introduce them in one by one and give at least 2 weeks trial for each.  Pruritus  See assessment and plan as above for rash.   Return if symptoms worsen or fail to improve.  Diagnostics: None.  Medication List:  Current Outpatient Medications  Medication Sig Dispense Refill  . cetirizine (ZYRTEC) 10 MG tablet Take 10 mg by mouth daily as needed for allergies.    . hydrOXYzine (ATARAX/VISTARIL) 25 MG tablet Take 1 tablet (25 mg total) by mouth at bedtime as needed for itching (1 hour before bedtime). 30 tablet 3  . sulfamethoxazole-trimethoprim (BACTRIM DS) 800-160 MG tablet TAKE 1 TABLET BY MOUTH TWICE A DAY FOR 3 DAYS    . SYNTHROID 137 MCG tablet     . triamcinolone cream (KENALOG) 0.1 %      No current facility-administered medications for this visit.    Allergies: No Known Allergies I reviewed her past medical history, social history, family history, and environmental history and no significant changes have been reported from previous visit on 04/30/2019.  Review of Systems  Constitutional: Negative for appetite change, chills, fever and unexpected weight change.  HENT: Negative for congestion and rhinorrhea.   Eyes: Negative for itching.  Respiratory: Negative for cough, chest tightness, shortness of breath and wheezing.  Gastrointestinal: Negative for abdominal pain.  Genitourinary: Negative for difficulty urinating.  Skin: Positive for rash (improved).  Allergic/Immunologic: Negative for environmental allergies and food allergies.  Neurological: Negative for headaches.   Objective: BP 118/62 (BP Location: Left Arm, Patient Position:  Sitting, Cuff Size: Normal)   Pulse 94   Temp 97.7 F (36.5 C) (Temporal)   Resp 17   SpO2 97%  There is no height or weight on file to calculate BMI. Physical Exam  Constitutional: She is oriented to person, place, and time. She appears well-developed and well-nourished.  HENT:  Head: Normocephalic and atraumatic.  Right Ear: External ear normal.  Left Ear: External ear normal.  Eyes: Conjunctivae and EOM are normal.  Neck: Neck supple.  Cardiovascular: Normal rate, regular rhythm and normal heart sounds. Exam reveals no gallop and no friction rub.  No murmur heard. Pulmonary/Chest: Effort normal and breath sounds normal. She has no wheezes. She has no rales.  Abdominal: Soft.  Neurological: She is alert and oriented to person, place, and time.  Skin: Skin is warm and dry. No rash noted.  Psychiatric: She has a normal mood and affect. Her behavior is normal.  Nursing note and vitals reviewed.  Previous notes and tests were reviewed. The plan was reviewed with the patient/family, and all questions/concerned were addressed.  It was my pleasure to see [name] today and participate in her care. Please feel free to contact me with any questions or concerns.  Sincerely,  Rexene Alberts, DO Allergy & Immunology  [facility] office: [phone] [facility] office: [place] office: [phone]

## 2019-05-28 NOTE — Patient Instructions (Addendum)
Rash/itching  Past skin testing showed: Negative to environmental allergy panel and basic common foods.   Stop hydroxyzine for 2 weeks and if no issues then stop zyrtec as well. If you have the itching/rash return then restart zyrtec 10mg  daily.   Continue with proper skin care.  If you are interested in re-starting the supplements, introduce them in 1 by 1 and give at least 2 weeks trial for each.  Follow up as needed.

## 2019-05-28 NOTE — Assessment & Plan Note (Signed)
Past history - Pruritic erythematous rash for the past 4 months with no triggers noted. Denies changes in diet, medications, personal care products or recent infections. Tried triamcinolone cream, zyrtec, benadryl and Singulair with some benefit. Had 2 courses of prednisone which helped but never had complete resolution of symptoms. 2020 skin testing showed: Negative to environmental allergy panel and basic common foods.  Interim history - Bloodwork showed: blood count, kidney function, liver function, electrolytes, inflammation markers and alpha gal (checks for red meat allergy) were all normal. ANA and your RNP antibody was positive. Patient had work up by rheumatology in the past and no diagnosis was made. Rash/pruritus completely gone after 2 weeks - changed laundry detergents, stopped all supplements and started daily antihistamines.   Stop hydroxyzine for 2 weeks and if no issues then stop zyrtec as well. If you have the itching/rash return then restart zyrtec 10mg  daily.   Continue with proper skin care.   If you are interested in re-starting the supplements, introduce them in one by one and give at least 2 weeks trial for each.

## 2019-07-01 ENCOUNTER — Other Ambulatory Visit: Payer: Self-pay | Admitting: Obstetrics and Gynecology

## 2019-07-01 DIAGNOSIS — Z1231 Encounter for screening mammogram for malignant neoplasm of breast: Secondary | ICD-10-CM

## 2019-07-08 DIAGNOSIS — D225 Melanocytic nevi of trunk: Secondary | ICD-10-CM | POA: Diagnosis not present

## 2019-07-08 DIAGNOSIS — Z85828 Personal history of other malignant neoplasm of skin: Secondary | ICD-10-CM | POA: Diagnosis not present

## 2019-07-08 DIAGNOSIS — L570 Actinic keratosis: Secondary | ICD-10-CM | POA: Diagnosis not present

## 2019-07-08 DIAGNOSIS — D2272 Melanocytic nevi of left lower limb, including hip: Secondary | ICD-10-CM | POA: Diagnosis not present

## 2019-07-08 DIAGNOSIS — L821 Other seborrheic keratosis: Secondary | ICD-10-CM | POA: Diagnosis not present

## 2019-08-17 ENCOUNTER — Ambulatory Visit
Admission: RE | Admit: 2019-08-17 | Discharge: 2019-08-17 | Disposition: A | Discharge status: Home / Self Care | Payer: BC Managed Care – PPO | Source: Ambulatory Visit | Attending: Obstetrics and Gynecology | Admitting: Obstetrics and Gynecology

## 2019-08-17 ENCOUNTER — Other Ambulatory Visit: Payer: Self-pay

## 2019-08-17 DIAGNOSIS — Z1231 Encounter for screening mammogram for malignant neoplasm of breast: Secondary | ICD-10-CM | POA: Diagnosis not present

## 2019-09-09 DIAGNOSIS — H524 Presbyopia: Secondary | ICD-10-CM | POA: Diagnosis not present

## 2020-01-23 ENCOUNTER — Ambulatory Visit: Payer: Self-pay | Attending: Internal Medicine

## 2020-01-23 DIAGNOSIS — Z23 Encounter for immunization: Secondary | ICD-10-CM

## 2020-01-23 NOTE — Progress Notes (Signed)
   Covid-19 Vaccination Clinic  Name:  Deborah Frederick    MRN: [id number] DOB: 06-19-1971  01/23/2020  [name] was observed post Covid-19 immunization for 15 minutes without incident. She was provided with Vaccine Information Sheet and instruction to access the V-Safe system.   [name] was instructed to call 911 with any severe reactions post vaccine: Marland Kitchen Difficulty breathing  . Swelling of face and throat  . A fast heartbeat  . A bad rash all over body  . Dizziness and weakness   Immunizations Administered    Name Date Dose VIS Date Route   Pfizer COVID-19 Vaccine 01/23/2020  8:42 AM 0.3 mL 09/25/2019 Intramuscular   Manufacturer: [employer], [employer]   Lot: [id number]   NDC: [address]-5

## 2020-02-15 ENCOUNTER — Ambulatory Visit: Payer: Self-pay | Attending: Internal Medicine

## 2020-02-15 DIAGNOSIS — Z23 Encounter for immunization: Secondary | ICD-10-CM

## 2020-02-15 NOTE — Progress Notes (Signed)
   Covid-19 Vaccination Clinic  Name:  Deborah Frederick    MRN: [id number] DOB: 1970-12-26  02/15/2020  [name] was observed post Covid-19 immunization for 15 minutes without incident. She was provided with Vaccine Information Sheet and instruction to access the V-Safe system.   [name] was instructed to call 911 with any severe reactions post vaccine: Marland Kitchen Difficulty breathing  . Swelling of face and throat  . A fast heartbeat  . A bad rash all over body  . Dizziness and weakness   Immunizations Administered    Name Date Dose VIS Date Route   Pfizer COVID-19 Vaccine 02/15/2020 11:13 AM 0.3 mL 12/09/2018 Intramuscular   Manufacturer: [employer], [employer]   Lot: [id number]   NDC: [address]-1

## 2020-07-07 ENCOUNTER — Other Ambulatory Visit: Payer: Self-pay | Admitting: Obstetrics and Gynecology

## 2020-07-07 DIAGNOSIS — Z1231 Encounter for screening mammogram for malignant neoplasm of breast: Secondary | ICD-10-CM

## 2020-08-19 ENCOUNTER — Ambulatory Visit
Admission: RE | Admit: 2020-08-19 | Discharge: 2020-08-19 | Disposition: A | Discharge status: Home / Self Care | Payer: No Typology Code available for payment source | Source: Ambulatory Visit | Attending: Obstetrics and Gynecology | Admitting: Obstetrics and Gynecology

## 2020-08-19 ENCOUNTER — Other Ambulatory Visit: Payer: Self-pay

## 2020-08-19 ENCOUNTER — Ambulatory Visit: Payer: Self-pay

## 2020-08-19 DIAGNOSIS — Z1231 Encounter for screening mammogram for malignant neoplasm of breast: Secondary | ICD-10-CM

## 2020-08-24 ENCOUNTER — Other Ambulatory Visit: Payer: Self-pay | Admitting: Obstetrics and Gynecology

## 2020-08-24 DIAGNOSIS — R928 Other abnormal and inconclusive findings on diagnostic imaging of breast: Secondary | ICD-10-CM

## 2020-09-01 ENCOUNTER — Ambulatory Visit: Payer: No Typology Code available for payment source

## 2020-09-01 ENCOUNTER — Other Ambulatory Visit: Payer: Self-pay | Admitting: Obstetrics and Gynecology

## 2020-09-01 ENCOUNTER — Ambulatory Visit
Admission: RE | Admit: 2020-09-01 | Discharge: 2020-09-01 | Disposition: A | Discharge status: Home / Self Care | Payer: No Typology Code available for payment source | Source: Ambulatory Visit | Attending: Obstetrics and Gynecology | Admitting: Obstetrics and Gynecology

## 2020-09-01 ENCOUNTER — Other Ambulatory Visit: Payer: Self-pay

## 2020-09-01 DIAGNOSIS — R928 Other abnormal and inconclusive findings on diagnostic imaging of breast: Secondary | ICD-10-CM

## 2020-09-06 ENCOUNTER — Other Ambulatory Visit: Payer: No Typology Code available for payment source

## 2020-09-14 ENCOUNTER — Ambulatory Visit
Admission: RE | Admit: 2020-09-14 | Discharge: 2020-09-14 | Disposition: A | Discharge status: Home / Self Care | Payer: No Typology Code available for payment source | Source: Ambulatory Visit | Attending: Obstetrics and Gynecology | Admitting: Obstetrics and Gynecology

## 2020-09-14 ENCOUNTER — Other Ambulatory Visit: Payer: Self-pay

## 2020-09-14 DIAGNOSIS — R928 Other abnormal and inconclusive findings on diagnostic imaging of breast: Secondary | ICD-10-CM

## 2021-02-22 ENCOUNTER — Other Ambulatory Visit: Payer: Self-pay | Admitting: Obstetrics and Gynecology

## 2021-02-22 DIAGNOSIS — Z1231 Encounter for screening mammogram for malignant neoplasm of breast: Secondary | ICD-10-CM

## 2021-04-19 ENCOUNTER — Other Ambulatory Visit: Payer: Self-pay

## 2021-04-19 ENCOUNTER — Ambulatory Visit
Admission: RE | Admit: 2021-04-19 | Discharge: 2021-04-19 | Disposition: A | Discharge status: Home / Self Care | Payer: No Typology Code available for payment source | Source: Ambulatory Visit | Attending: Obstetrics and Gynecology | Admitting: Obstetrics and Gynecology

## 2021-04-19 ENCOUNTER — Other Ambulatory Visit: Payer: Self-pay | Admitting: Obstetrics and Gynecology

## 2021-04-19 DIAGNOSIS — N6489 Other specified disorders of breast: Secondary | ICD-10-CM

## 2021-04-19 DIAGNOSIS — R921 Mammographic calcification found on diagnostic imaging of breast: Secondary | ICD-10-CM

## 2021-04-19 DIAGNOSIS — Z1231 Encounter for screening mammogram for malignant neoplasm of breast: Secondary | ICD-10-CM

## 2021-04-27 ENCOUNTER — Ambulatory Visit
Admission: RE | Admit: 2021-04-27 | Discharge: 2021-04-27 | Disposition: A | Discharge status: Home / Self Care | Payer: No Typology Code available for payment source | Source: Ambulatory Visit | Attending: Obstetrics and Gynecology | Admitting: Obstetrics and Gynecology

## 2021-04-27 ENCOUNTER — Other Ambulatory Visit: Payer: Self-pay | Admitting: Obstetrics and Gynecology

## 2021-04-27 ENCOUNTER — Other Ambulatory Visit: Payer: Self-pay

## 2021-04-27 DIAGNOSIS — N6489 Other specified disorders of breast: Secondary | ICD-10-CM

## 2021-04-27 DIAGNOSIS — R921 Mammographic calcification found on diagnostic imaging of breast: Secondary | ICD-10-CM

## 2021-11-01 ENCOUNTER — Ambulatory Visit
Admission: RE | Admit: 2021-11-01 | Discharge: 2021-11-01 | Disposition: A | Discharge status: Home / Self Care | Payer: No Typology Code available for payment source | Source: Ambulatory Visit | Attending: Obstetrics and Gynecology | Admitting: Obstetrics and Gynecology

## 2021-11-01 ENCOUNTER — Other Ambulatory Visit: Payer: Self-pay

## 2021-11-01 DIAGNOSIS — N6489 Other specified disorders of breast: Secondary | ICD-10-CM

## 2021-11-01 DIAGNOSIS — R921 Mammographic calcification found on diagnostic imaging of breast: Secondary | ICD-10-CM

## 2022-06-25 ENCOUNTER — Other Ambulatory Visit: Payer: Self-pay | Admitting: Internal Medicine

## 2022-06-25 DIAGNOSIS — E785 Hyperlipidemia, unspecified: Secondary | ICD-10-CM

## 2022-08-16 ENCOUNTER — Ambulatory Visit
Admission: RE | Admit: 2022-08-16 | Discharge: 2022-08-16 | Disposition: A | Discharge status: Home / Self Care | Payer: No Typology Code available for payment source | Source: Ambulatory Visit | Attending: Internal Medicine | Admitting: Internal Medicine

## 2022-08-16 DIAGNOSIS — E785 Hyperlipidemia, unspecified: Secondary | ICD-10-CM

## 2022-09-14 ENCOUNTER — Other Ambulatory Visit: Payer: No Typology Code available for payment source

## 2022-09-24 ENCOUNTER — Other Ambulatory Visit: Payer: Self-pay | Admitting: Obstetrics and Gynecology

## 2022-09-24 DIAGNOSIS — R921 Mammographic calcification found on diagnostic imaging of breast: Secondary | ICD-10-CM

## 2022-11-07 ENCOUNTER — Ambulatory Visit
Admission: RE | Admit: 2022-11-07 | Discharge: 2022-11-07 | Disposition: A | Discharge status: Home / Self Care | Payer: No Typology Code available for payment source | Source: Ambulatory Visit | Attending: Obstetrics and Gynecology | Admitting: Obstetrics and Gynecology

## 2022-11-07 DIAGNOSIS — R921 Mammographic calcification found on diagnostic imaging of breast: Secondary | ICD-10-CM

## 2023-07-31 ENCOUNTER — Other Ambulatory Visit (HOSPITAL_COMMUNITY): Payer: Self-pay

## 2023-09-30 ENCOUNTER — Other Ambulatory Visit: Payer: Self-pay | Admitting: Obstetrics and Gynecology

## 2023-09-30 DIAGNOSIS — Z1231 Encounter for screening mammogram for malignant neoplasm of breast: Secondary | ICD-10-CM

## 2023-10-12 IMAGING — MG DIGITAL DIAGNOSTIC BILAT W/ TOMO W/ CAD
8 of 13 series · 8 of 33 positions shown · non-contrast
Comparison: Previous exam(s).

CLINICAL DATA: 50-year-old female presenting for 1 year follow-up
of probable benign right breast calcifications. Patient had benign
right breast biopsies in Monday September, 2020.

EXAM:
DIGITAL DIAGNOSTIC BILATERAL MAMMOGRAM WITH TOMOSYNTHESIS AND CAD
TECHNIQUE: Bilateral digital diagnostic mammography and breast tomosynthesis
was performed. The images were evaluated with computer-aided
detection.

[R LM]
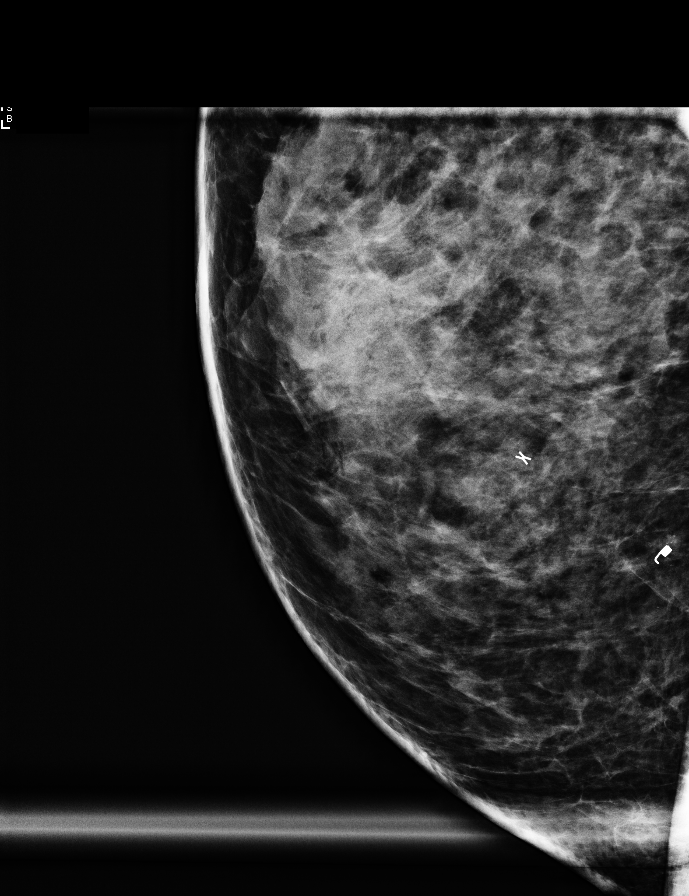

[R CC (1 of 2)]
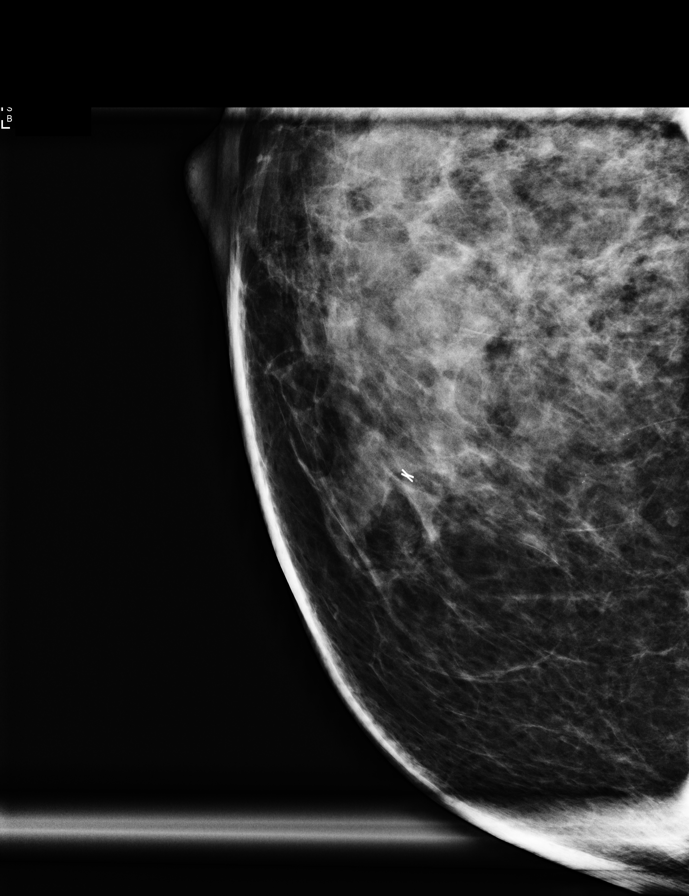

[R CC (2 of 2)]
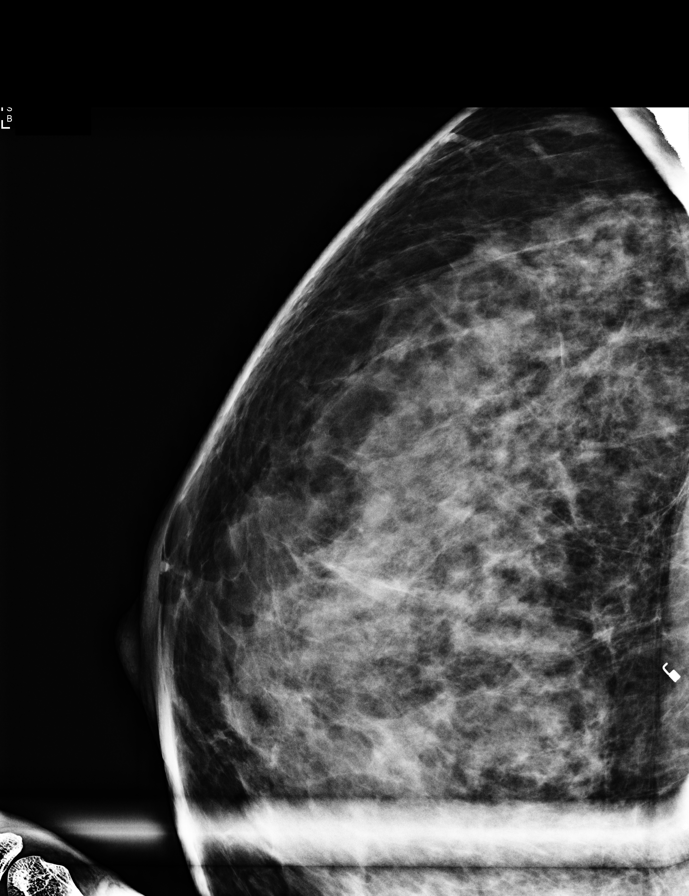

[R CC synth-2D (1 of 2)]
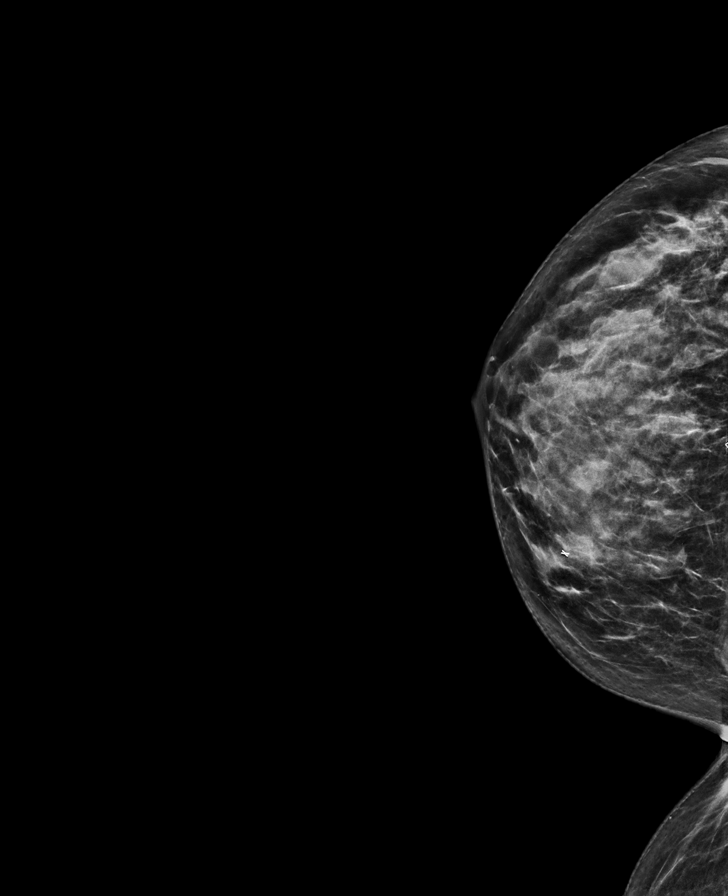

[R MLO synth-2D]
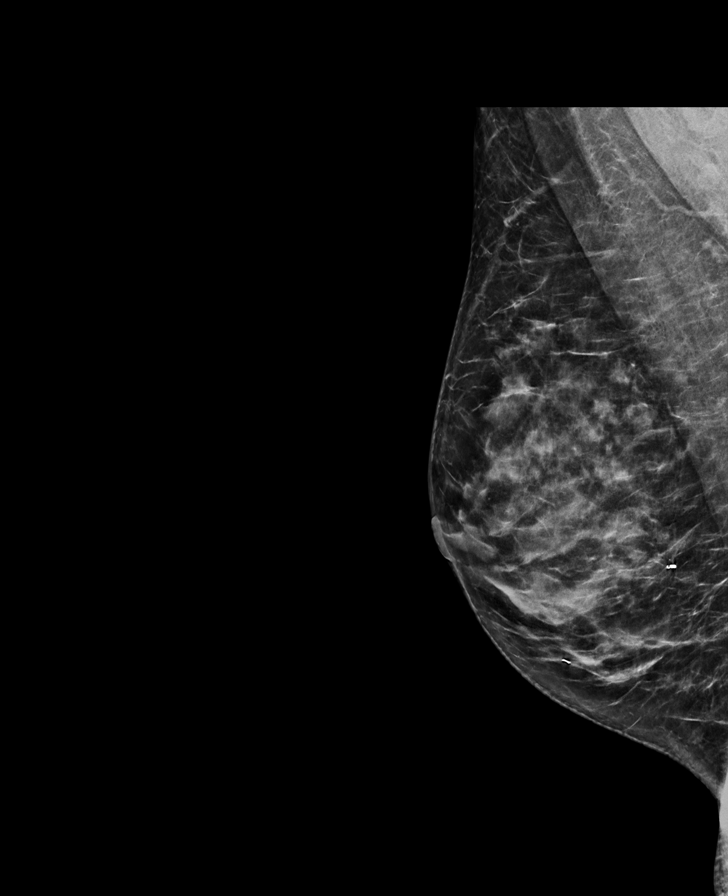

[R CC synth-2D (2 of 2)]
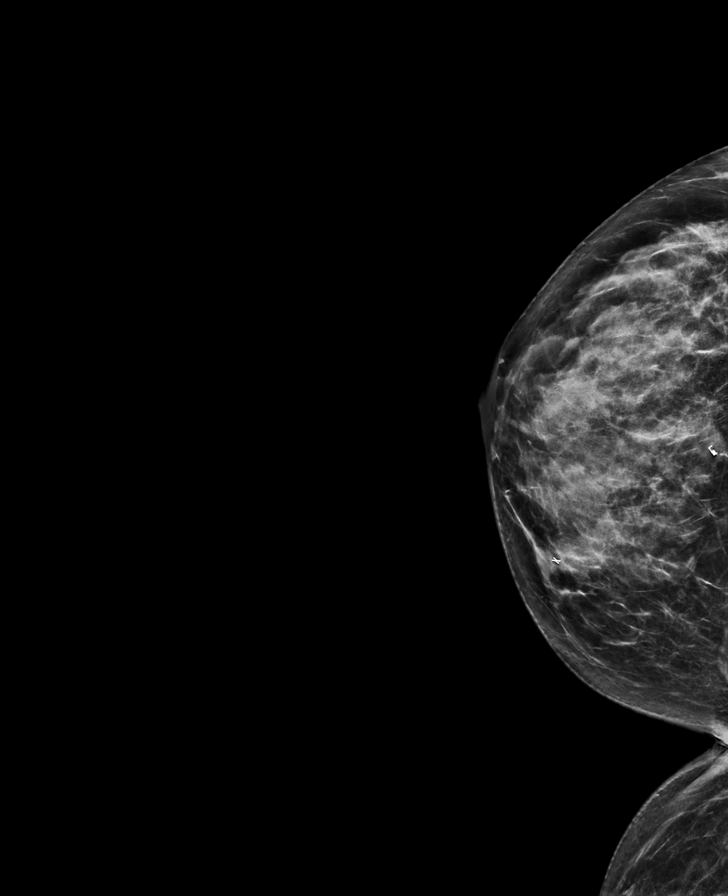

[L CC synth-2D]
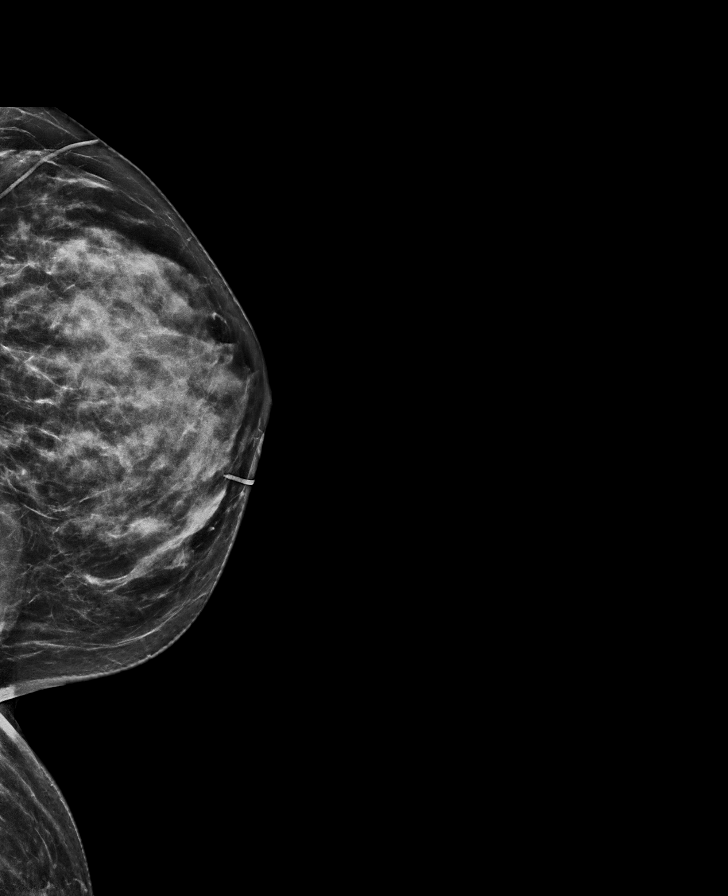

[L MLO synth-2D]
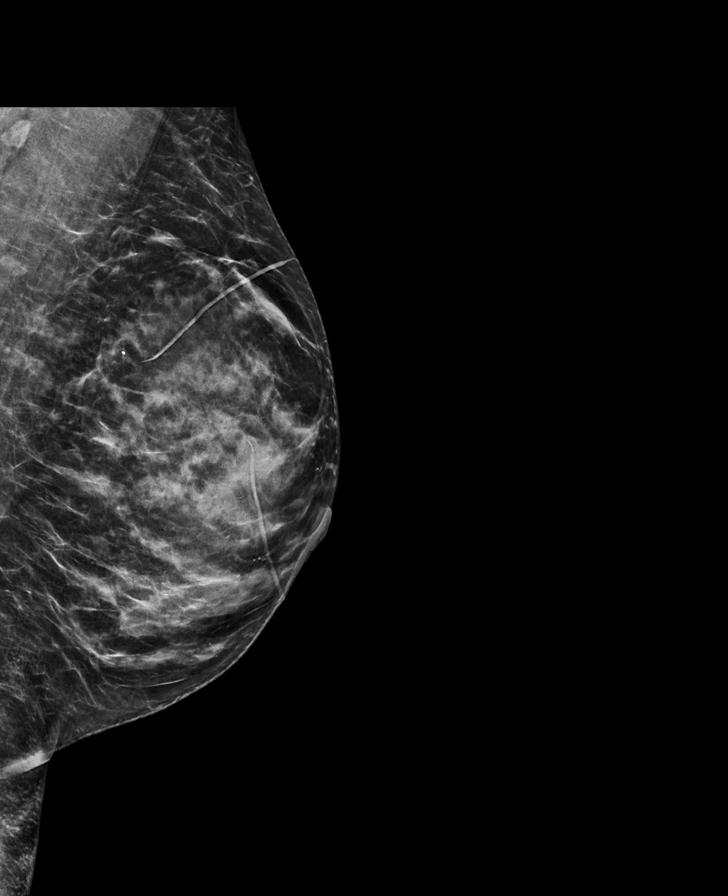

[8 of 33 positions shown; findings below may reference images not displayed]

ACR Breast Density Category c: The breast tissue is heterogeneously
dense, which may obscure small masses.
FINDINGS: There are X and coil shaped clips in the right breast. Punctate
calcifications in the medial aspect of the right breast are stable.
No suspicious mass or malignant type microcalcifications identified
in either breast.
IMPRESSION: Stable probable benign calcifications in the medial aspect of the
right breast.

RECOMMENDATION:
Bilateral diagnostic mammogram in 1 year is recommended to document
stability of the calcifications in the right breast for 2 years.

I have discussed the findings and recommendations with the patient.
If applicable, a reminder letter will be sent to the patient
regarding the next appointment.

BI-RADS CATEGORY  3: Probably benign.

## 2023-11-11 ENCOUNTER — Ambulatory Visit: Payer: No Typology Code available for payment source

## 2023-11-14 ENCOUNTER — Ambulatory Visit: Payer: Self-pay

## 2023-12-03 ENCOUNTER — Ambulatory Visit
Admission: RE | Admit: 2023-12-03 | Discharge: 2023-12-03 | Disposition: A | Discharge status: Home / Self Care | Payer: Commercial Managed Care - PPO | Source: Ambulatory Visit | Attending: Obstetrics and Gynecology | Admitting: Obstetrics and Gynecology

## 2023-12-03 DIAGNOSIS — Z1231 Encounter for screening mammogram for malignant neoplasm of breast: Secondary | ICD-10-CM

## 2024-11-03 ENCOUNTER — Other Ambulatory Visit: Payer: Self-pay | Admitting: Obstetrics and Gynecology

## 2024-11-03 DIAGNOSIS — Z1231 Encounter for screening mammogram for malignant neoplasm of breast: Secondary | ICD-10-CM

## 2024-12-04 ENCOUNTER — Ambulatory Visit
